# Patient Record
Sex: Female | Born: 2000 | Race: White | Hispanic: No | Smoking: Never smoker
Health system: Southern US, Community
[De-identification: ages and names within clinical notes are randomized; demographics above are authoritative.]

---
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
## 2022-04-10 ENCOUNTER — Telehealth: Payer: Self-pay | Admitting: Psychiatry

## 2022-04-10 DIAGNOSIS — F410 Panic disorder [episodic paroxysmal anxiety] without agoraphobia: Secondary | ICD-10-CM

## 2022-04-10 DIAGNOSIS — F411 Generalized anxiety disorder: Secondary | ICD-10-CM

## 2022-04-10 NOTE — Telephone Encounter (Addendum)
Pt had stopped Citalopram and due to stress and anxiety from college and travel requesting to start Citalopram again.  [facility]   ?Pt contact # [phone]    Has apt 5/25 ?Advise Pt if Rx sent before apt. ?

## 2022-04-10 NOTE — Telephone Encounter (Incomplete Revision)
Pt had stopped Citalopram and due to stress and anxiety from college and travel requesting to start Citalopram again.  [facility]   ?Pt contact # [phone]    Has apt 5/25 ?Advise Pt if Rx sent before apt. ?

## 2022-04-14 NOTE — Telephone Encounter (Signed)
She can restart citalopram 10 mg daily.  After 1 week if she is tolerating it well she can increase to 20 mg daily which she had been on in the past. ?If she needs the prescription sent in, please send in citalopram 20 mg tablets, one half daily for 1 week, then 1 daily #30, 1 refill ?

## 2022-04-14 NOTE — Telephone Encounter (Signed)
Pt had stopped the citalopram because was doing well. Now that stressors are coming on Mom was asking if okay to restart? She previously started at 10 mg then dose had been increased. Okay to restart at 10 mg? She wasn't due back until 1 1/2 years.  ?

## 2022-04-15 MED ORDER — CITALOPRAM HYDROBROMIDE 20 MG PO TABS: 10.0000 mg | ORAL_TABLET | Freq: Every day | ORAL | 1 refills | Status: DC

## 2022-04-15 NOTE — Telephone Encounter (Signed)
Spoke with Mom and pt on speaker phone and advised about the Citalopram. ? ?Mom asking if she needs to keep the apt in May or move it to July before going off to college or does she need to be seen until her yearly follow up in 2024? ?

## 2022-04-15 NOTE — Telephone Encounter (Signed)
We do not have DPR for mom. Rx sent as requested.  ?

## 2022-04-15 NOTE — Telephone Encounter (Signed)
She has been doing poorly and we just changed or restarted citalopram.  She definitely needs to keep the appointment in May. ?

## 2022-04-16 NOTE — Telephone Encounter (Signed)
Patient notified

## 2022-05-21 ENCOUNTER — Ambulatory Visit (INDEPENDENT_AMBULATORY_CARE_PROVIDER_SITE_OTHER): Payer: 59 | Admitting: Psychiatry

## 2022-05-21 ENCOUNTER — Encounter: Payer: Self-pay | Admitting: Psychiatry

## 2022-05-21 DIAGNOSIS — F410 Panic disorder [episodic paroxysmal anxiety] without agoraphobia: Secondary | ICD-10-CM | POA: Diagnosis not present

## 2022-05-21 DIAGNOSIS — F411 Generalized anxiety disorder: Secondary | ICD-10-CM | POA: Diagnosis not present

## 2022-05-21 MED ORDER — CITALOPRAM HYDROBROMIDE 20 MG PO TABS: 20.0000 mg | ORAL_TABLET | Freq: Every day | ORAL | 1 refills | Status: DC

## 2022-05-21 NOTE — Progress Notes (Signed)
Sheena LingoSalem Wirz [id number] 07/11/2001 20 y.o.  Subjective:   Patient ID:  Sheena Villarreal is a 21 y.o. (DOB 07/11/2001) female.  Chief Complaint:  Chief Complaint  Patient presents with   Follow-up   Anxiety    HPI Sheena Villarreal presents to the office today for follow-up of panic disorder and generalized anxiety disorder.  Patient transferred from Dr. Marlyne BeardsJennings. Has been on citalopram 40 mg daily and lorazepam 0.5 mg.  Last visit with Dr. Marlyne BeardsJennings September 2021 with no med changes.  04/07/21 appt noted: Doing well with citalopram 20 mg with no panic since here. Reduced citalopram to 20 mg bc she felt like she was doing well overall.  No sig anxiety on day to day and no sig avoidance.   No SE. 11th grade.   Not taken any lorazepam. M says she's doing extrememly well from where she was before when was avoidant significantly.  In past had transition into school year.  Been better in HS.   Son has genetic disorder with Sz disorder and she witnessed one and at 21yo witnessed F in anaphylactic shock.  Even now wants to know where everyone is and if they are OK.  Bullied in 6th grade and started not wanting to go to school. M notes she's a perfectionistic and driven. Pursuing golf.   Plan: Wean citalopram Starting after school in June to 10 mg for 2 mos and stop.  08/04/2021 appointment with the following noted: Went to 10 mg citalopram for a week with a URI and felt anxious.  Parents noticed it.  Goes to gym again.  Never needed lorazepam.  Only one class needed to graduate and will take only a few classes.  Second semester early release and online classes.Sheena Villarreal. Starts school 8/[employer].   Patient reports stable mood and denies depressed or irritable moods.  Patient denies any recent difficulty with anxiety.  Patient denies difficulty with sleep initiation or maintenance. Denies appetite disturbance.  Patient reports that energy and motivation have been good.  Patient denies any difficulty with  concentration.  Patient denies any suicidal ideation. ? Possible weight gain and struggles to lose despite exercise. Plan: Wean citalopram Starting now by 5 mg montly and call if there's a probel m.  12/01/2021 appointment with the following noted: School going well.  No problems off the citalopram and no problem weaning it like she did the first time. Off citalopram for a month. Admitted to play golf scholarship at [employer]. Havent' had any anxiety usually. Took lorazepam rarely.  Going to [place] next year and would like to have lorazepam 0.5 mg prn.   Patient reports stable mood and denies depressed or irritable moods.  Patient denies any recent difficulty with anxiety.  Patient denies difficulty with sleep initiation or maintenance. Denies appetite disturbance.  Patient reports that energy and motivation have been good.  Patient denies any difficulty with concentration.  Patient denies any suicidal ideation. Plan: She is off psychiatric medicine and wants to stay off at this time with no current symptoms.  04/10/2022 phone call: Due to stress and anxiety wanting to restart citalopram.  Therefore restart citalopram 10 mg daily for a week and then if tolerated increase to 20 mg daily.  05/21/2022 appointment with the following noted: seen with Mom About 3 weeks before the call increased anxiety bc getting closer to leaving for college in August 13. No specific fear or thought about it. Restarted citalopram 10 mg since above.  Feels better and  under control now. No depression nor sleep problems. HS senior with a week left.  Doing well in school. No panic nor agoraphobia. Mo verifies the same.  Not nearly as severe as before. Working out twice daiy bc helps the anxiety.   No SE. Some concern about weight gain with citalopram.  Past Psychiatric Medication Trials: Citalopram 40 No fluoxetine  Review of Systems:  Review of Systems  Constitutional:  Negative for unexpected weight change.   Neurological:  Negative for tremors.  Psychiatric/Behavioral:  The patient is not nervous/anxious.    Medications: I have reviewed the patient's current medications.  Current Outpatient Medications  Medication Sig Dispense Refill   ALBUTEROL IN Inhale into the lungs.     citalopram (CELEXA) 20 MG tablet Take 1 tablet (20 mg total) by mouth at bedtime. 90 tablet 1   LORazepam (ATIVAN) 0.5 MG tablet Take 1 tablet (0.5 mg total) by mouth 2 (two) times daily as needed for anxiety (Panic). (Patient not taking: Reported on 05/21/2022) 30 tablet 0   No current facility-administered medications for this visit.    Medication Side Effects: None  Allergies:  Allergies  Allergen Reactions   Azithromycin     History reviewed. No pertinent past medical history.  History reviewed. No pertinent family history.  Social History   Socioeconomic History   Marital status: Unknown    Spouse name: Not on file   Number of children: Not on file   Years of education: Not on file   Highest education level: Not on file  Occupational History   Not on file  Tobacco Use   Smoking status: Never   Smokeless tobacco: Never  Vaping Use   Vaping Use: Never used  Substance and Sexual Activity   Alcohol use: Never   Drug use: Never   Sexual activity: Never  Other Topics Concern   Not on file  Social History Narrative   Not on file   Social Determinants of Health   Financial Resource Strain: Not on file  Food Insecurity: Not on file  Transportation Needs: Not on file  Physical Activity: Not on file  Stress: Not on file  Social Connections: Not on file  Intimate Partner Violence: Not on file    Past Medical History, Surgical history, Social history, and Family history were reviewed and updated as appropriate.   Please see review of systems for further details on the patient's review from today.   Objective:   Physical Exam:  There were no vitals taken for this visit.  Physical  Exam Constitutional:      General: She is not in acute distress. Musculoskeletal:        General: No deformity.  Neurological:     Mental Status: She is alert and oriented to person, place, and time.     Coordination: Coordination normal.  Psychiatric:        Attention and Perception: Attention and perception normal. She does not perceive auditory or visual hallucinations.        Mood and Affect: Mood normal. Mood is not anxious or depressed. Affect is not labile, blunt, angry or inappropriate.        Speech: Speech normal.        Behavior: Behavior normal.        Thought Content: Thought content normal. Thought content is not paranoid or delusional. Thought content does not include homicidal or suicidal ideation. Thought content does not include homicidal or suicidal plan.  Cognition and Memory: Cognition and memory normal.        Judgment: Judgment normal.     Comments: Insight intact Anxiety back under control.    Lab Review:  No results found for: NA, K, CL, CO2, GLUCOSE, BUN, CREATININE, CALCIUM, PROT, ALBUMIN, AST, ALT, ALKPHOS, BILITOT, GFRNONAA, GFRAA  No results found for: WBC, RBC, HGB, HCT, PLT, MCV, MCH, MCHC, RDW, LYMPHSABS, MONOABS, EOSABS, BASOSABS  No results found for: POCLITH, LITHIUM   No results found for: PHENYTOIN, PHENOBARB, VALPROATE, CBMZ   .res Assessment: Plan:    Sarika was seen today for follow-up and anxiety.  Diagnoses and all orders for this visit:  Generalized anxiety disorder -     citalopram (CELEXA) 20 MG tablet; Take 1 tablet (20 mg total) by mouth at bedtime.  Panic disorder -     citalopram (CELEXA) 20 MG tablet; Take 1 tablet (20 mg total) by mouth at bedtime.   Greater than 50% of 30 minutes face to face time with patient was spent on counseling and coordination of care. We discussed extensively her diagnosis and history of anxiety and history of treatment.   She weaned off citalopram November 2022 and did well until April  2023.  Her anxiety increased somewhat but not having panic attacks.  She felt like she needed to resume the citalopram.  She feels the anxiety is related to anticipation of graduation and then going off to college.  The anxiety is back under control with 10 mg daily.  We discussed the usual dosing range and her history of having taken as much as 40 mg citalopram.  She can continue the 10 mg citalopram since anxiety is under control at present but if it starts increasing then increase the dose to 20 mg daily.  If is not well controlled get back in touch with the office.  If she needs to change the dose get in touch with the office to inform the office.  Discussed side effects including the possibility of weight gain and if it occurs we could switch to S-Citalopram easily which generally has less weight gain risk.  Ok prn lorazepam.  Disc caffeine and panic.  Follow-up 1 year.   Call if you have any recurrence of symptoms.  Mother and daughter agree  Meredith Staggers MD, DFAPA    Please see After Visit Summary for patient specific instructions.  Future Appointments  Date Time Provider Department Center  12/14/2022  9:00 AM Cottle, Steva Ready., MD [facility] None  05/31/2023  9:00 AM Cottle, Steva Ready., MD [facility] None     No orders of the defined types were placed in this encounter.   -------------------------------

## 2022-12-14 ENCOUNTER — Ambulatory Visit: Payer: Commercial Managed Care - PPO | Admitting: Psychiatry

## 2023-05-31 ENCOUNTER — Ambulatory Visit (INDEPENDENT_AMBULATORY_CARE_PROVIDER_SITE_OTHER): Payer: Commercial Managed Care - PPO | Admitting: Psychiatry

## 2023-05-31 DIAGNOSIS — Z91199 Patient's noncompliance with other medical treatment and regimen due to unspecified reason: Secondary | ICD-10-CM

## 2023-05-31 NOTE — Progress Notes (Signed)
No show

## 2023-06-14 ENCOUNTER — Encounter: Payer: Self-pay | Admitting: Psychiatry

## 2023-06-14 ENCOUNTER — Ambulatory Visit: Payer: 59 | Admitting: Psychiatry

## 2023-06-14 DIAGNOSIS — F410 Panic disorder [episodic paroxysmal anxiety] without agoraphobia: Secondary | ICD-10-CM

## 2023-06-14 DIAGNOSIS — F411 Generalized anxiety disorder: Secondary | ICD-10-CM | POA: Diagnosis not present

## 2023-06-14 DIAGNOSIS — F4000 Agoraphobia, unspecified: Secondary | ICD-10-CM

## 2023-06-14 MED ORDER — FLUOXETINE HCL 10 MG PO CAPS: ORAL_CAPSULE | ORAL | 0 refills | Status: DC

## 2023-06-14 MED ORDER — LORAZEPAM 0.5 MG PO TABS: 0.5000 mg | ORAL_TABLET | Freq: Two times a day (BID) | ORAL | 0 refills | Status: DC | PRN

## 2023-06-14 NOTE — Progress Notes (Signed)
Sheena Villarreal [id number] [birth year] 22 y.o.  Subjective:   Patient ID:  Sheena Villarreal is a 22 y.o. (DOB 01-Mar-2001) female.  Chief Complaint:  Chief Complaint  Patient presents with   Follow-up    Urgent request for appt    HPI Sheena Villarreal presents to the office today for follow-up of panic disorder and generalized anxiety disorder.  Patient transferred from Dr. Marlyne Beards. Has been on citalopram 40 mg daily and lorazepam 0.5 mg.  Last visit with Dr. Marlyne Beards September 2021 with no med changes.  04/07/21 appt noted: Doing well with citalopram 20 mg with no panic since here. Reduced citalopram to 20 mg bc she felt like she was doing well overall.  No sig anxiety on day to day and no sig avoidance.   No SE. 11th grade.   Not taken any lorazepam. M says she's doing extrememly well from where she was before when was avoidant significantly.  In past had transition into school year.  Been better in HS.   Son has genetic disorder with Sz disorder and she witnessed one and at 22yo witnessed F in anaphylactic shock.  Even now wants to know where everyone is and if they are OK.  Bullied in 6th grade and started not wanting to go to school. M notes she's a perfectionistic and driven. Pursuing golf.   Plan: Wean citalopram Starting after school in June to 10 mg for 2 mos and stop.  08/04/2021 appointment with the following noted: Went to 10 mg citalopram for a week with a URI and felt anxious.  Parents noticed it.  Goes to gym again.  Never needed lorazepam.  Only one class needed to graduate and will take only a few classes.  Second semester early release and online classes.Sheena Villarreal school 8/[employer].   Patient reports stable mood and denies depressed or irritable moods.  Patient denies any recent difficulty with anxiety.  Patient denies difficulty with sleep initiation or maintenance. Denies appetite disturbance.  Patient reports that energy and motivation have been good.  Patient denies any  difficulty with concentration.  Patient denies any suicidal ideation. ? Possible weight gain and struggles to lose despite exercise. Plan: Wean citalopram Starting now by 5 mg montly and call if there's a probel m.  12/01/2021 appointment with the following noted: School going well.  No problems off the citalopram and no problem weaning it like she did the first time. Off citalopram for a month. 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 Havent' had any anxiety usually. Took lorazepam rarely.  Going to [place] next year and would like to have lorazepam 0.5 mg prn.   Patient reports stable mood and denies depressed or irritable moods.  Patient denies any recent difficulty with anxiety.  Patient denies difficulty with sleep initiation or maintenance. Denies appetite disturbance.  Patient reports that energy and motivation have been good.  Patient denies any difficulty with concentration.  Patient denies any suicidal ideation. Plan: She is off psychiatric medicine and wants to stay off at this time with no current symptoms.  04/10/2022 phone call: Due to stress and anxiety wanting to restart citalopram.  Therefore restart citalopram 10 mg daily for a week and then if tolerated increase to 20 mg daily.  05/21/2022 appointment with the following noted: seen with Mom About 3 weeks before the call increased anxiety bc getting closer to leaving for college in August 13. No specific fear or thought about it. Restarted citalopram 10 mg since above.  Feels better and under control now. No depression nor sleep problems. HS senior with a week left.  Doing well in school. No panic nor agoraphobia. Sheena Villarreal verifies the same.  Not nearly as severe as before. Working out twice daiy bc helps the anxiety.   No SE. Some concern about weight gain with citalopram. Plan: She can continue the 10 mg citalopram since anxiety is under control at present but if it starts increasing then increase the dose to 20 mg daily.     06/14/23 appt:  seen with mother Not seen in a year.  NS last month.  F called for urgent appt and got in today DT cancellation. Feeling anxious again.  Lower motivation. Went off of it partly due to wt gain with it apparently. . Was fine until the last 2-3 weeks.  M notes more irritalbe.  Before was more .  Rare alcohol.  No sig beer.   F has bilateral kidney cancer.  She was doing well in school .   Feels more sad lately.  Home for 6 week but only px in last couple of weeks.   Taking more lorazepam for anxiety.  Helps but makes her tired.   Drinks a lot of coffee.  Less energy drinks.   Straight As with 16 hours.  Also golf scholarship.   Off until August.   M notes tends to tear up when people in family have health problems. Goes to [employer].     Past Psychiatric Medication Trials: Citalopram 40 wt gain. No fluoxetine  Review of Systems:  Review of Systems  Constitutional:  Negative for unexpected weight change.  Neurological:  Negative for tremors.  Psychiatric/Behavioral:  Positive for dysphoric mood. The patient is nervous/anxious.     Medications: I have reviewed the patient's current medications.  Current Outpatient Medications  Medication Sig Dispense Refill   ALBUTEROL IN Inhale into the lungs.     FLUoxetine (PROZAC) 10 MG capsule 1 daily for 1 week, then 2 daily 60 capsule 0   LORazepam (ATIVAN) 0.5 MG tablet Take 1 tablet (0.5 mg total) by mouth 2 (two) times daily as needed for anxiety (Panic). 30 tablet 0   No current facility-administered medications for this visit.    Medication Side Effects: None  Allergies:  Allergies  Allergen Reactions   Azithromycin     History reviewed. No pertinent past medical history.  History reviewed. No pertinent family history.  Social History   Socioeconomic History   Marital status: Unknown    Spouse name: Not on file   Number of children: Not on file   Years of education: Not on file   Highest education  level: Not on file  Occupational History   Not on file  Tobacco Use   Smoking status: Never   Smokeless tobacco: Never  Vaping Use   Vaping Use: Never used  Substance and Sexual Activity   Alcohol use: Never   Drug use: Never   Sexual activity: Never  Other Topics Concern   Not on file  Social History Narrative   Not on file   Social Determinants of Health   Financial Resource Strain: Not on file  Food Insecurity: Not on file  Transportation Needs: Not on file  Physical Activity: Not on file  Stress: Not on file  Social Connections: Not on file  Intimate Partner Violence: Not on file    Past Medical History, Surgical history, Social history, and Family history were reviewed and updated as appropriate.  Please see review of systems for further details on the patient's review from today.   Objective:   Physical Exam:  There were no vitals taken for this visit.  Physical Exam Constitutional:      General: She is not in acute distress. Musculoskeletal:        General: No deformity.  Neurological:     Mental Status: She is alert and oriented to person, place, and time.     Coordination: Coordination normal.  Psychiatric:        Attention and Perception: Attention and perception normal. She does not perceive auditory or visual hallucinations.        Mood and Affect: Mood is anxious and depressed. Affect is not labile, blunt, angry or inappropriate.        Speech: Speech normal.        Behavior: Behavior normal.        Thought Content: Thought content normal. Thought content is not paranoid or delusional. Thought content does not include homicidal or suicidal ideation. Thought content does not include homicidal or suicidal plan.        Cognition and Memory: Cognition and memory normal.        Judgment: Judgment normal.     Comments: Insight intact      Lab Review:  No results found for: "NA", "K", "CL", "CO2", "GLUCOSE", "BUN", "CREATININE", "CALCIUM", "PROT",  "ALBUMIN", "AST", "ALT", "ALKPHOS", "BILITOT", "GFRNONAA", "GFRAA"  No results found for: "WBC", "RBC", "HGB", "HCT", "PLT", "MCV", "MCH", "MCHC", "RDW", "LYMPHSABS", "MONOABS", "EOSABS", "BASOSABS"  No results found for: "POCLITH", "LITHIUM"   No results found for: "PHENYTOIN", "PHENOBARB", "VALPROATE", "CBMZ"   .res Assessment: Plan:    Galen was seen today for follow-up.  Diagnoses and all orders for this visit:  Generalized anxiety disorder -     FLUoxetine (PROZAC) 10 MG capsule; 1 daily for 1 week, then 2 daily -     LORazepam (ATIVAN) 0.5 MG tablet; Take 1 tablet (0.5 mg total) by mouth 2 (two) times daily as needed for anxiety (Panic).  Panic disorder -     FLUoxetine (PROZAC) 10 MG capsule; 1 daily for 1 week, then 2 daily -     LORazepam (ATIVAN) 0.5 MG tablet; Take 1 tablet (0.5 mg total) by mouth 2 (two) times daily as needed for anxiety (Panic).  Agoraphobia -     FLUoxetine (PROZAC) 10 MG capsule; 1 daily for 1 week, then 2 daily -     LORazepam (ATIVAN) 0.5 MG tablet; Take 1 tablet (0.5 mg total) by mouth 2 (two) times daily as needed for anxiety (Panic).   30 minutes face to face time with patient was spent on counseling and coordination of care. We discussed extensively her diagnosis and history of anxiety and history of treatment.   She weaned off citalopram November 2022 and did well until April 2023.  Her anxiety increased somewhat but not having panic attacks.  She felt like she needed to resume the citalopram.  She feels the anxiety is related to anticipation of graduation and then going off to college.  The anxiety was back under control with 10 mg daily. But eventually wt gain.  Disc wt risk with citalopram bc had wt gain but benefit.  Rec flueoxetine as alternative. 10 mg daily for a week, then 20 mg daily.  Ok prn lorazepam.  Disc caffeine and panic.  Follow-up few weeks.   Call if you have any recurrence of symptoms.  Mother and daughter  agree  Meredith Staggers MD, DFAPA    Please see After Visit Summary for patient specific instructions.  No future appointments.    No orders of the defined types were placed in this encounter.   -------------------------------

## 2023-07-21 ENCOUNTER — Ambulatory Visit (INDEPENDENT_AMBULATORY_CARE_PROVIDER_SITE_OTHER): Payer: 59 | Admitting: Psychiatry

## 2023-07-26 ENCOUNTER — Encounter: Payer: Self-pay | Admitting: Psychiatry

## 2023-07-26 ENCOUNTER — Ambulatory Visit: Payer: 59 | Admitting: Psychiatry

## 2023-07-26 DIAGNOSIS — F410 Panic disorder [episodic paroxysmal anxiety] without agoraphobia: Secondary | ICD-10-CM | POA: Diagnosis not present

## 2023-07-26 DIAGNOSIS — F4000 Agoraphobia, unspecified: Secondary | ICD-10-CM

## 2023-07-26 DIAGNOSIS — F411 Generalized anxiety disorder: Secondary | ICD-10-CM

## 2023-07-26 MED ORDER — FLUOXETINE HCL 20 MG PO CAPS: 20.0000 mg | ORAL_CAPSULE | Freq: Every day | ORAL | 1 refills | Status: DC

## 2023-07-26 NOTE — Progress Notes (Signed)
Sheena Villarreal [id number] 12-28-2001 22 y.o.  Subjective:   Patient ID:  Sheena Villarreal is a 22 y.o. (DOB 01/28/2001) female.  Chief Complaint:  Chief Complaint  Patient presents with   Follow-up   Anxiety   Medication Reaction    HPI Sheena Villarreal presents to the office today for follow-up of panic disorder and generalized anxiety disorder.  Patient transferred from Dr. Marlyne Beards. Has been on citalopram 40 mg daily and lorazepam 0.5 mg.  Last visit with Dr. Marlyne Beards September 2021 with no med changes.  04/07/21 appt noted: Doing well with citalopram 20 mg with no panic since here. Reduced citalopram to 20 mg bc she felt like she was doing well overall.  No sig anxiety on day to day and no sig avoidance.   No SE. 11th grade.   Not taken any lorazepam. M says she's doing extrememly well from where she was before when was avoidant significantly.  In past had transition into school year.  Been better in HS.   Son has genetic disorder with Sz disorder and she witnessed one and at 22yo witnessed F in anaphylactic shock.  Even now wants to know where everyone is and if they are OK.  Bullied in 6th grade and started not wanting to go to school. M notes she's a perfectionistic and driven. Pursuing golf.   Plan: Wean citalopram Starting after school in June to 10 mg for 2 mos and stop.  08/04/2021 appointment with the following noted: Went to 10 mg citalopram for a week with a URI and felt anxious.  Parents noticed it.  Goes to gym again.  Never needed lorazepam.  Only one class needed to graduate and will take only a few classes.  Second semester early release and online classes.[employer]ena Villarreal school 8/[employer].   Patient reports stable mood and denies depressed or irritable moods.  Patient denies any recent difficulty with anxiety.  Patient denies difficulty with sleep initiation or maintenance. Denies appetite disturbance.  Patient reports that energy and motivation have been good.  Patient denies  any difficulty with concentration.  Patient denies any suicidal ideation. ? Possible weight gain and struggles to lose despite exercise. Plan: Wean citalopram Starting now by 5 mg montly and call if there's a probel m.  12/01/2021 appointment with the following noted: School going well.  No problems off the citalopram and no problem weaning it like she did the first time. Off citalopram for a month. 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 Havent' had any anxiety usually. Took lorazepam rarely.  Going to [place] next year and would like to have lorazepam 0.5 mg prn.   Patient reports stable mood and denies depressed or irritable moods.  Patient denies any recent difficulty with anxiety.  Patient denies difficulty with sleep initiation or maintenance. Denies appetite disturbance.  Patient reports that energy and motivation have been good.  Patient denies any difficulty with concentration.  Patient denies any suicidal ideation. Plan: She is off psychiatric medicine and wants to stay off at this time with no current symptoms.  04/10/2022 phone call: Due to stress and anxiety wanting to restart citalopram.  Therefore restart citalopram 10 mg daily for a week and then if tolerated increase to 20 mg daily.  05/21/2022 appointment with the following noted: seen with Mom About 3 weeks before the call increased anxiety bc getting closer to leaving for college in August 13. No specific fear or thought about it. Restarted citalopram 10 mg since above.  Feels better and under control now. No depression nor sleep problems. HS senior with a week left.  Doing well in school. No panic nor agoraphobia. Mo verifies the same.  Not nearly as severe as before. Working out twice daiy bc helps the anxiety.   No SE. Some concern about weight gain with citalopram. Plan: She can continue the 10 mg citalopram since anxiety is under control at present but if it starts increasing then increase the dose to 20 mg  daily.    06/14/23 appt:  seen with mother Not seen in a year.  NS last month.  F called for urgent appt and got in today DT cancellation. Feeling anxious again.  Lower motivation. Went off of it partly due to wt gain with it apparently. . Was fine until the last 2-3 weeks.  M notes more irritalbe.  Before was more .  Rare alcohol.  No sig beer.   F has bilateral kidney cancer.  She was doing well in school .   Feels more sad lately.  Home for 6 week but only px in last couple of weeks.   Taking more lorazepam for anxiety.  Helps but makes her tired.   Drinks a lot of coffee.  Less energy drinks.   Straight As with 16 hours.  Also golf scholarship.   Off until August.   M notes tends to tear up when people in family have health problems. Goes to [employer].   Plan: Disc wt risk with citalopram bc had wt gain but benefit.  Rec flueoxetine as alternative. 10 mg daily for a week, then 20 mg daily.   07/26/23 appt :  Switch to fluoxetine 20 mg daily.  No sig lorazepam. Seems like it is doing good so far.Anxiety is better, not gone but better. It is better with SE wt gain.  No other SE.   Not dep.  Motivation.   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  Feels good so far about it. No other concerns.   Past Psychiatric Medication Trials: Citalopram 40 wt gain. No fluoxetine  Review of Systems:  Review of Systems  Constitutional:  Negative for unexpected weight change.  Neurological:  Negative for tremors.  Psychiatric/Behavioral:  Positive for dysphoric mood. The patient is nervous/anxious.     Medications: I have reviewed the patient's current medications.  Current Outpatient Medications  Medication Sig Dispense Refill   ALBUTEROL IN Inhale into the lungs.     LORazepam (ATIVAN) 0.5 MG tablet Take 1 tablet (0.5 mg total) by mouth 2 (two) times daily as needed for anxiety (Panic). 30 tablet 0   FLUoxetine (PROZAC) 20 MG capsule Take 1 capsule (20 mg total) by mouth daily. 90 capsule 1    No current facility-administered medications for this visit.    Medication Side Effects: None  Allergies:  Allergies  Allergen Reactions   Azithromycin     History reviewed. No pertinent past medical history.  History reviewed. No pertinent family history.  Social History   Socioeconomic History   Marital status: Unknown    Spouse name: Not on file   Number of children: Not on file   Years of education: Not on file   Highest education level: Not on file  Occupational History   Not on file  Tobacco Use   Smoking status: Never   Smokeless tobacco: Never  Vaping Use   Vaping status: Never Used  Substance and Sexual Activity   Alcohol use: Never   Drug use:  Never   Sexual activity: Never  Other Topics Concern   Not on file  Social History Narrative   Not on file   Social Determinants of Health   Financial Resource Strain: Not on file  Food Insecurity: Not on file  Transportation Needs: Not on file  Physical Activity: Not on file  Stress: Not on file  Social Connections: Not on file  Intimate Partner Violence: Not on file    Past Medical History, Surgical history, Social history, and Family history were reviewed and updated as appropriate.   Please see review of systems for further details on the patient's review from today.   Objective:   Physical Exam:  There were no vitals taken for this visit.  Physical Exam Constitutional:      General: She is not in acute distress. Musculoskeletal:        General: No deformity.  Neurological:     Mental Status: She is alert and oriented to person, place, and time.     Coordination: Coordination normal.  Psychiatric:        Attention and Perception: Attention and perception normal. She does not perceive auditory or visual hallucinations.        Mood and Affect: Mood is not anxious or depressed. Affect is not labile, blunt, angry or inappropriate.        Speech: Speech normal.        Behavior: Behavior normal.         Thought Content: Thought content normal. Thought content is not paranoid or delusional. Thought content does not include homicidal or suicidal ideation. Thought content does not include suicidal plan.        Cognition and Memory: Cognition and memory normal.        Judgment: Judgment normal.     Comments: Insight intact      Lab Review:  No results found for: "NA", "K", "CL", "CO2", "GLUCOSE", "BUN", "CREATININE", "CALCIUM", "PROT", "ALBUMIN", "AST", "ALT", "ALKPHOS", "BILITOT", "GFRNONAA", "GFRAA"  No results found for: "WBC", "RBC", "HGB", "HCT", "PLT", "MCV", "MCH", "MCHC", "RDW", "LYMPHSABS", "MONOABS", "EOSABS", "BASOSABS"  No results found for: "POCLITH", "LITHIUM"   No results found for: "PHENYTOIN", "PHENOBARB", "VALPROATE", "CBMZ"   .res Assessment: Plan:    Essica was seen today for follow-up, anxiety and medication reaction.  Diagnoses and all orders for this visit:  Generalized anxiety disorder -     FLUoxetine (PROZAC) 20 MG capsule; Take 1 capsule (20 mg total) by mouth daily.  Panic disorder -     FLUoxetine (PROZAC) 20 MG capsule; Take 1 capsule (20 mg total) by mouth daily.  Agoraphobia -     FLUoxetine (PROZAC) 20 MG capsule; Take 1 capsule (20 mg total) by mouth daily.    30 minutes face to face time with patient was spent on counseling and coordination of care. We discussed extensively her diagnosis and history of anxiety and history of treatment.   She weaned off citalopram November 2022 and did well until April 2023.  Her anxiety increased somewhat but not having panic attacks.  She felt like she needed to resume the citalopram.  The anxiety was back under control with citalopram 10 mg daily. But eventually wt gain.  Changed to fluoxetine with good control and no further wt gain.  Cont fluoxetine 20 mg daily.  Ok prn lorazepam.  Disc caffeine and panic.  Follow-up Dec break    Call if you have any recurrence of symptoms.  Mother and  daughter agree  Meredith Staggers  MD, DFAPA    Please see After Visit Summary for patient specific instructions.  No future appointments.    No orders of the defined types were placed in this encounter.   -------------------------------

## 2023-12-13 ENCOUNTER — Ambulatory Visit (INDEPENDENT_AMBULATORY_CARE_PROVIDER_SITE_OTHER): Payer: 59 | Admitting: Psychiatry

## 2024-01-28 ENCOUNTER — Other Ambulatory Visit: Payer: Self-pay

## 2024-01-28 ENCOUNTER — Telehealth: Payer: Self-pay | Admitting: Psychiatry

## 2024-01-28 DIAGNOSIS — F411 Generalized anxiety disorder: Secondary | ICD-10-CM

## 2024-01-28 DIAGNOSIS — F410 Panic disorder [episodic paroxysmal anxiety] without agoraphobia: Secondary | ICD-10-CM

## 2024-01-28 DIAGNOSIS — F4000 Agoraphobia, unspecified: Secondary | ICD-10-CM

## 2024-01-28 MED ORDER — FLUOXETINE HCL 20 MG PO CAPS: 20.0000 mg | ORAL_CAPSULE | Freq: Every day | ORAL | 0 refills | Status: DC

## 2024-01-28 NOTE — Telephone Encounter (Signed)
Prozac sent to [facility].

## 2024-01-28 NOTE — Telephone Encounter (Signed)
Mystique called at 10:00 to RS appt we cancelled in Dec.  She is now scheduled for 4/7.  She needs a refill of her Fluoxetine.  Send to   [facility] - [place], [place] - [address]

## 2024-04-03 ENCOUNTER — Ambulatory Visit: Payer: 59 | Admitting: Psychiatry

## 2024-04-07 ENCOUNTER — Telehealth: Payer: Self-pay | Admitting: Psychiatry

## 2024-04-07 NOTE — Telephone Encounter (Signed)
 A script for a 90-day supply was sent in January and based on that date would not be due for a RF until the end of April. Called mom to let her know it was too early to fill and they had never picked up the RF from January. There is nothing needed from [place] at this time.

## 2024-04-07 NOTE — Telephone Encounter (Signed)
 Pt's mom called at 10:45a.  Mom is on DPR.  Pt is in a golf tournament 2 hours away.  She is trying to get refill of generic Prozac to take to her.  Mom leaves Sat night.  Pharmacy closes Sat at 1p.  Pls send to   [facility] - [place], [place] - [address] [place] [address] Phone: [phone]  Fax: [phone]    Next appt 5/8

## 2024-05-04 ENCOUNTER — Telehealth: Admitting: Psychiatry

## 2024-05-04 ENCOUNTER — Encounter: Payer: Self-pay | Admitting: Psychiatry

## 2024-05-04 DIAGNOSIS — F411 Generalized anxiety disorder: Secondary | ICD-10-CM

## 2024-05-04 DIAGNOSIS — F410 Panic disorder [episodic paroxysmal anxiety] without agoraphobia: Secondary | ICD-10-CM

## 2024-05-04 DIAGNOSIS — F4000 Agoraphobia, unspecified: Secondary | ICD-10-CM

## 2024-05-04 MED ORDER — FLUOXETINE HCL 20 MG PO CAPS: 20.0000 mg | ORAL_CAPSULE | Freq: Every day | ORAL | 3 refills | Status: AC

## 2024-05-04 MED ORDER — LORAZEPAM 0.5 MG PO TABS: 0.5000 mg | ORAL_TABLET | Freq: Two times a day (BID) | ORAL | 0 refills | Status: AC | PRN

## 2024-05-04 NOTE — Progress Notes (Signed)
 Sheena Villarreal [id number] 2001/04/24 22 y.o.  Video Visit via My Chart  I connected with pt by video using My Chart and verified that I am speaking with the correct person using two identifiers.   I discussed the limitations, risks, security and privacy concerns of performing an evaluation and management service by My Chart  and the availability of in person appointments. I also discussed with the patient that there may be a patient responsible charge related to this service. The patient expressed understanding and agreed to proceed.  I discussed the assessment and treatment plan with the patient. The patient was provided an opportunity to ask questions and all were answered. The patient agreed with the plan and demonstrated an understanding of the instructions.   The patient was advised to call back or seek an in-person evaluation if the symptoms worsen or if the condition fails to improve as anticipated.  I provided 30 minutes of video time during this encounter.  The patient was located at home and the provider was located office. Session [id number]  Subjective:   Patient ID:  Sheena Villarreal is a 23 y.o. (DOB 10-Jul-2001) female.  Chief Complaint:  Chief Complaint  Patient presents with   Follow-up    HPI Roger Vitorino presents to the office today for follow-up of panic disorder and generalized anxiety disorder.  Patient transferred from Dr. Esther Hem. Has been on citalopram  40 mg daily and lorazepam  0.5 mg.  Last visit with Dr. Esther Hem September 2021 with no med changes.  04/07/21 appt noted: Doing well with citalopram  20 mg with no panic since here. Reduced citalopram  to 20 mg bc she felt like she was doing well overall.  No sig anxiety on day to day and no sig avoidance.   No SE. 11th grade.   Not taken any lorazepam . M says she's doing extrememly well from where she was before when was avoidant significantly.  In past had transition into school year.  Been better in HS.   Son has genetic  disorder with Sz disorder and she witnessed one and at 23yo witnessed F in anaphylactic shock.  Even now wants to know where everyone is and if they are OK.  Bullied in 6th grade and started not wanting to go to school. M notes she's a perfectionistic and driven. Pursuing golf.   Plan: Wean citalopram  Starting after school in June to 10 mg for 2 mos and stop.  08/04/2021 appointment with the following noted: Went to 10 mg citalopram  for a week with a URI and felt anxious.  Parents noticed it.  Goes to gym again.  Never needed lorazepam .  Only one class needed to graduate and will take only a few classes.  Second semester early release and online classes.[employer] school 8/[employer].   Patient reports stable mood and denies depressed or irritable moods.  Patient denies any recent difficulty with anxiety.  Patient denies difficulty with sleep initiation or maintenance. Denies appetite disturbance.  Patient reports that energy and motivation have been good.  Patient denies any difficulty with concentration.  Patient denies any suicidal ideation. ? Possible weight gain and struggles to lose despite exercise. Plan: Wean citalopram  Starting now by 5 mg montly and call if there's a probel m.  12/01/2021 appointment with the following noted: School going well.  No problems off the citalopram  and no problem weaning it like she did the first time. Off citalopram  for a month. 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 Havent' had  any anxiety usually. Took lorazepam  rarely.  Going to [place] next year and would like to have lorazepam  0.5 mg prn.   Patient reports stable mood and denies depressed or irritable moods.  Patient denies any recent difficulty with anxiety.  Patient denies difficulty with sleep initiation or maintenance. Denies appetite disturbance.  Patient reports that energy and motivation have been good.  Patient denies any difficulty with concentration.  Patient denies any suicidal  ideation. Plan: She is off psychiatric medicine and wants to stay off at this time with no current symptoms.  04/10/2022 phone call: Due to stress and anxiety wanting to restart citalopram .  Therefore restart citalopram  10 mg daily for a week and then if tolerated increase to 20 mg daily.  05/21/2022 appointment with the following noted: seen with Mom About 3 weeks before the call increased anxiety bc getting closer to leaving for college in August 13. No specific fear or thought about it. Restarted citalopram  10 mg since above.  Feels better and under control now. No depression nor sleep problems. HS senior with a week left.  Doing well in school. No panic nor agoraphobia. Mo verifies the same.  Not nearly as severe as before. Working out twice daiy bc helps the anxiety.   No SE. Some concern about weight gain with citalopram . Plan: She can continue the 10 mg citalopram  since anxiety is under control at present but if it starts increasing then increase the dose to 20 mg daily.    06/14/23 appt:  seen with mother Not seen in a year.  NS last month.  F called for urgent appt and got in today DT cancellation. Feeling anxious again.  Lower motivation. Went off of it partly due to wt gain with it apparently. . Was fine until the last 2-3 weeks.  M notes more irritalbe.  Before was more .  Rare alcohol.  No sig beer.   F has bilateral kidney cancer.  She was doing well in school .   Feels more sad lately.  Home for 6 week but only px in last couple of weeks.   Taking more lorazepam  for anxiety.  Helps but makes her tired.   Drinks a lot of coffee.  Less energy drinks.   Straight As with 16 hours.  Also golf scholarship.   Off until August.   M notes tends to tear up when people in family have health problems. Goes to [employer].   Plan: Disc wt risk with citalopram  bc had wt gain but benefit.  Rec flueoxetine as alternative. 10 mg daily for a week, then 20 mg daily.   07/26/23 appt :  Switch  to fluoxetine  20 mg daily.  No sig lorazepam . Seems like it is doing good so far.Anxiety is better, not gone but better. It is better with SE wt gain.  No other SE.   Not dep.  Motivation.   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  Feels good so far about it. No other concerns.  05/04/24 appt noted: Med: fluoxetine  20 and rare lorazepam  Good.  Still in school and will be there for summer No SE.   No dep .  Anxiety good.   Sleep is ok.  [employer]ising [employer]   Past Psychiatric Medication Trials: Citalopram  40 wt gain. No fluoxetine   Review of Systems:  Review of Systems  Constitutional:  Negative for unexpected weight change.  Neurological:  Negative for tremors.  Psychiatric/Behavioral:  Negative for dysphoric mood. The patient is not nervous/anxious.  Medications: I have reviewed the patient's current medications.  Current Outpatient Medications  Medication Sig Dispense Refill   ALBUTEROL IN Inhale into the lungs.     FLUoxetine  (PROZAC ) 20 MG capsule Take 1 capsule (20 mg total) by mouth daily. 90 capsule 0   LORazepam  (ATIVAN ) 0.5 MG tablet Take 1 tablet (0.5 mg total) by mouth 2 (two) times daily as needed for anxiety (Panic). 30 tablet 0   No current facility-administered medications for this visit.    Medication Side Effects: None  Allergies:  Allergies  Allergen Reactions   Azithromycin     History reviewed. No pertinent past medical history.  History reviewed. No pertinent family history.  Social History   Socioeconomic History   Marital status: Unknown    Spouse name: Not on file   Number of children: Not on file   Years of education: Not on file   Highest education level: Not on file  Occupational History   Not on file  Tobacco Use   Smoking status: Never   Smokeless tobacco: Never  Vaping Use   Vaping status: Never Used  Substance and Sexual Activity   Alcohol use: Never   Drug use: Never   Sexual activity: Never  Other Topics Concern   Not on file   Social History Narrative   Not on file   Social Drivers of Health   Financial Resource Strain: Not on file  Food Insecurity: Not on file  Transportation Needs: Not on file  Physical Activity: Not on file  Stress: Not on file  Social Connections: Not on file  Intimate Partner Violence: Not on file    Past Medical History, Surgical history, Social history, and Family history were reviewed and updated as appropriate.   Please see review of systems for further details on the patient's review from today.   Objective:   Physical Exam:  There were no vitals taken for this visit.  Physical Exam Neurological:     Mental Status: She is alert and oriented to person, place, and time.     Cranial Nerves: No dysarthria.  Psychiatric:        Attention and Perception: Attention and perception normal.        Mood and Affect: Mood normal.        Speech: Speech normal.        Behavior: Behavior is cooperative.        Thought Content: Thought content normal. Thought content is not paranoid or delusional. Thought content does not include suicidal ideation. Thought content does not include suicidal plan.        Cognition and Memory: Cognition and memory normal.        Judgment: Judgment normal.     Comments: Insight intact     Lab Review:  No results found for: "NA", "K", "CL", "CO2", "GLUCOSE", "BUN", "CREATININE", "CALCIUM", "PROT", "ALBUMIN", "AST", "ALT", "ALKPHOS", "BILITOT", "GFRNONAA", "GFRAA"  No results found for: "WBC", "RBC", "HGB", "HCT", "PLT", "MCV", "MCH", "MCHC", "RDW", "LYMPHSABS", "MONOABS", "EOSABS", "BASOSABS"  No results found for: "POCLITH", "LITHIUM"   No results found for: "PHENYTOIN", "PHENOBARB", "VALPROATE", "CBMZ"   .res Assessment: Plan:    Shamir was seen today for follow-up.  Diagnoses and all orders for this visit:  Generalized anxiety disorder  Panic disorder  Agoraphobia   15 minutes non  face to face time with patient .  We discussed  her  diagnosis and history of anxiety and history of treatment.   She weaned off citalopram  November  2022 and did well until April 2023.  Her anxiety increased somewhat but not having panic attacks.  She felt like she needed to resume the citalopram .  The anxiety was back under control with citalopram  10 mg daily. But eventually wt gain.  Changed to fluoxetine  with good control and no further wt gain.  Cont fluoxetine  20 mg daily and not stop unless something changes until graduates in 2 years.  Ok prn lorazepam .  Disc caffeine and panic.  Follow-up 1 yr bc good resp and no sig bz    Call if you have any recurrence of symptoms.  Mother and daughter agree  Nori Beat MD, DFAPA    Please see After Visit Summary for patient specific instructions.  No future appointments.    No orders of the defined types were placed in this encounter.   -------------------------------

## 2025-01-03 ENCOUNTER — Ambulatory Visit (HOSPITAL_BASED_OUTPATIENT_CLINIC_OR_DEPARTMENT_OTHER)
Admission: EM | Admit: 2025-01-03 | Discharge: 2025-01-03 | Disposition: A | Discharge status: Home / Self Care | Attending: Family Medicine | Admitting: Family Medicine

## 2025-01-03 ENCOUNTER — Encounter (HOSPITAL_BASED_OUTPATIENT_CLINIC_OR_DEPARTMENT_OTHER): Payer: Self-pay

## 2025-01-03 DIAGNOSIS — B349 Viral infection, unspecified: Secondary | ICD-10-CM | POA: Diagnosis not present

## 2025-01-03 LAB — POC COVID19/FLU A&B COMBO
Covid Antigen, POC: NEGATIVE
Influenza A Antigen, POC: NEGATIVE
Influenza B Antigen, POC: NEGATIVE

## 2025-01-03 NOTE — Discharge Instructions (Signed)
 Viral illness-no concerns on exam.  Recommend symptomatic treatment for relief of symptoms. Rest, hydrate and follow-up as needed

## 2025-01-03 NOTE — ED Provider Notes (Signed)
 " [facility]    CSN: [id number] Arrival date & time: 01/03/25  1233      History   Chief Complaint Chief Complaint  Patient presents with   Sore Throat   Nasal Congestion    HPI Sheena Villarreal is a 24 y.o. female.   Pt is a 24 year old female that presents with  sore throat and runny nose. Started today. Denies fever.  Cough from post nasal drip. States going back to school at [employer] and plays golf so requesting medication for symptoms.    Sore Throat    History reviewed. No pertinent past medical history.  Patient Active Problem List   Diagnosis Date Noted   Generalized anxiety disorder 02/13/2019   Panic disorder 02/13/2019   Agoraphobia 02/13/2019    History reviewed. No pertinent surgical history.  OB History   No obstetric history on file.      Home Medications    Prior to Admission medications  Medication Sig Start Date End Date Taking? Authorizing Provider  ALBUTEROL IN Inhale into the lungs.    [provider]  FLUoxetine  (PROZAC ) 20 MG capsule Take 1 capsule (20 mg total) by mouth daily. 05/04/24   Cottle, Lorene KANDICE Raddle., MD  LORazepam  (ATIVAN ) 0.5 MG tablet Take 1 tablet (0.5 mg total) by mouth 2 (two) times daily as needed for anxiety (Panic). 05/04/24   Cottle, Lorene KANDICE Raddle., MD    Family History History reviewed. No pertinent family history.  Social History Social History[1]   Allergies   Azithromycin   Review of Systems Review of Systems See HPI  Physical Exam Triage Vital Signs ED Triage Vitals  Encounter Vitals Group     BP 01/03/25 1254 107/70     Girls Systolic BP Percentile --      Girls Diastolic BP Percentile --      Boys Systolic BP Percentile --      Boys Diastolic BP Percentile --      Pulse Rate 01/03/25 1254 76     Resp 01/03/25 1254 20     Temp 01/03/25 1254 98.7 F (37.1 C)     Temp Source 01/03/25 1254 Oral     SpO2 01/03/25 1254 99 %     Weight --      Height --      Head Circumference --       Peak Flow --      Pain Score 01/03/25 1256 4     Pain Loc --      Pain Education --      Exclude from Growth Chart --    No data found.  Updated Vital Signs BP 107/70 (BP Location: Right Arm)   Pulse 76   Temp 98.7 F (37.1 C) (Oral)   Resp 20   LMP 12/31/2024   SpO2 99%   Visual Acuity Right Eye Distance:   Left Eye Distance:   Bilateral Distance:    Right Eye Near:   Left Eye Near:    Bilateral Near:     Physical Exam Constitutional:      General: She is not in acute distress.    Appearance: Normal appearance. She is not ill-appearing, toxic-appearing or diaphoretic.  HENT:     Head: Normocephalic and atraumatic.     Right Ear: Tympanic membrane and ear canal normal.     Left Ear: Tympanic membrane and ear canal normal.     Nose: Congestion present.     Mouth/Throat:  Pharynx: Oropharynx is clear.  Eyes:     Conjunctiva/sclera: Conjunctivae normal.  Cardiovascular:     Rate and Rhythm: Normal rate and regular rhythm.     Pulses: Normal pulses.     Heart sounds: Normal heart sounds.  Pulmonary:     Effort: Pulmonary effort is normal.     Breath sounds: Normal breath sounds.  Skin:    General: Skin is warm and dry.  Neurological:     Mental Status: She is alert.  Psychiatric:        Mood and Affect: Mood normal.      UC Treatments / Results  Labs (all labs ordered are listed, but only abnormal results are displayed) Labs Reviewed  POC COVID19/FLU A&B COMBO - Normal    EKG   Radiology No results found.  Procedures Procedures (including critical care time)  Medications Ordered in UC Medications - No data to display  Initial Impression / Assessment and Plan / UC Course  I have reviewed the triage vital signs and the nursing notes.  Pertinent labs & imaging results that were available during my care of the patient were reviewed by me and considered in my medical decision making (see chart for details).     Viral illness-no concerns on  exam.  Recommend symptomatic treatment for relief of symptoms. Rest, hydrate and follow-up as needed  Final Clinical Impressions(s) / UC Diagnoses   Final diagnoses:  Viral illness     Discharge Instructions      Viral illness-no concerns on exam.  Recommend symptomatic treatment for relief of symptoms. Rest, hydrate and follow-up as needed      ED Prescriptions   None    PDMP not reviewed this encounter.     [1]  Social History Tobacco Use   Smoking status: Never   Smokeless tobacco: Never  Vaping Use   Vaping status: Never Used  Substance Use Topics   Alcohol use: Never   Drug use: Never     Adah Wilbert LABOR, FNP 01/04/25 [phone]  "

## 2025-01-03 NOTE — ED Triage Notes (Addendum)
 Woke up this morning with sore throat and runny nose. Denies fever.  Cough from post nasal drip. States going back to school at [employer] and plays golf so requesting medication for symptoms and testing per her mother.
